# Patient Record
Sex: Male | Born: 1985 | Race: White | Hispanic: No | Marital: Single | Smoking: Never smoker
Health system: Southern US, Community
[De-identification: ages and names within clinical notes are randomized; demographics above are authoritative.]

## PROBLEM LIST (undated history)

## (undated) DIAGNOSIS — J45990 Exercise induced bronchospasm: Secondary | ICD-10-CM

## (undated) DIAGNOSIS — I429 Cardiomyopathy, unspecified: Secondary | ICD-10-CM

## (undated) DIAGNOSIS — F419 Anxiety disorder, unspecified: Secondary | ICD-10-CM

## (undated) DIAGNOSIS — K859 Acute pancreatitis without necrosis or infection, unspecified: Secondary | ICD-10-CM

## (undated) DIAGNOSIS — M1990 Unspecified osteoarthritis, unspecified site: Secondary | ICD-10-CM

## (undated) DIAGNOSIS — K219 Gastro-esophageal reflux disease without esophagitis: Secondary | ICD-10-CM

## (undated) HISTORY — PX: PILONIDAL CYST EXCISION: SHX744

---
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
## 2005-05-17 ENCOUNTER — Emergency Department: Payer: Self-pay | Admitting: Unknown Physician Specialty

## 2005-06-27 ENCOUNTER — Ambulatory Visit: Payer: Self-pay | Admitting: Gastroenterology

## 2006-09-04 HISTORY — PX: CHOLECYSTECTOMY: SHX55

## 2007-03-10 ENCOUNTER — Emergency Department: Payer: Self-pay | Admitting: Emergency Medicine

## 2009-06-02 ENCOUNTER — Ambulatory Visit: Payer: Self-pay | Admitting: Rheumatology

## 2009-09-02 ENCOUNTER — Ambulatory Visit: Payer: Self-pay | Admitting: Gastroenterology

## 2009-11-13 ENCOUNTER — Observation Stay: Payer: Self-pay | Admitting: Internal Medicine

## 2009-11-17 ENCOUNTER — Emergency Department: Payer: Self-pay | Admitting: Emergency Medicine

## 2009-11-19 ENCOUNTER — Ambulatory Visit: Payer: Self-pay | Admitting: Gastroenterology

## 2009-12-06 ENCOUNTER — Ambulatory Visit: Payer: Self-pay | Admitting: Gastroenterology

## 2009-12-11 ENCOUNTER — Emergency Department: Payer: Self-pay | Admitting: Unknown Physician Specialty

## 2010-08-20 IMAGING — CT CT ABD-PELV W/ CM
1 of 2 series · 15 of 32 positions shown, 19 images · non-contrast
Comparison: none

REASON FOR EXAM: GENERALIZED ABD PAIN NAUSEA VOMITING EVAL CBD ESPECIALLY
QUESTION DILAT.Alibert...
COMMENTS:

PROCEDURE:     CT  - CT ABDOMEN / PELVIS  W  - November 19, 2009  [DATE]
RESULT:
HISTORY: Nausea vomiting.

[Series 2: soft tissue · axial · 0.70mm/px · z∈[-361,+79]mm · 15 of 96 slices shown, 19 images]
[im 4/96  soft-tissue]
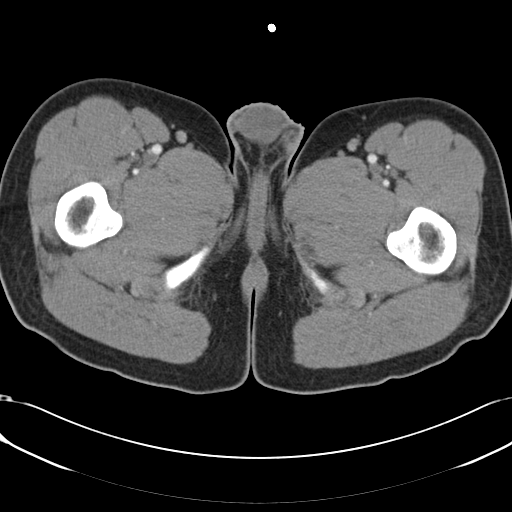
[im 4/96  bone]
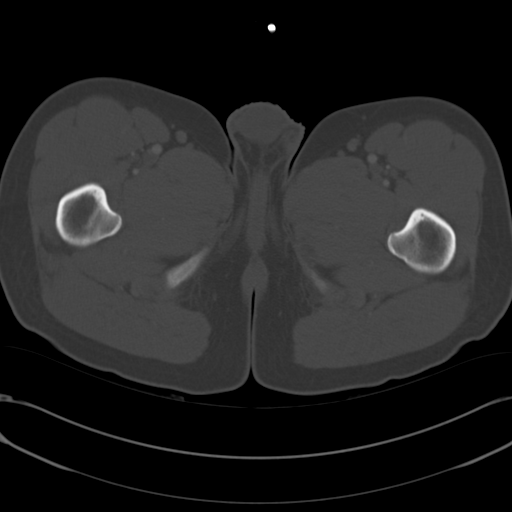
[im 12/96  soft-tissue]
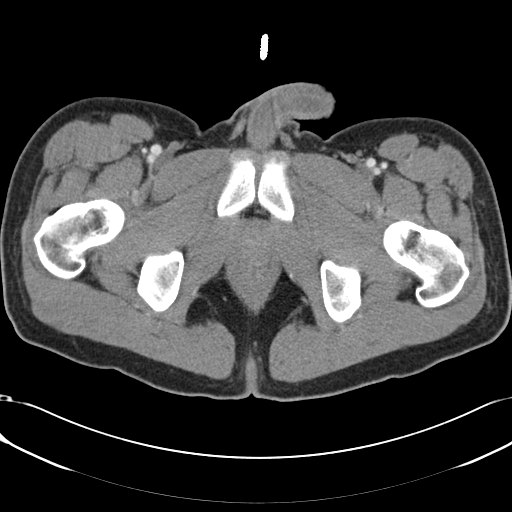
[im 20/96  soft-tissue]
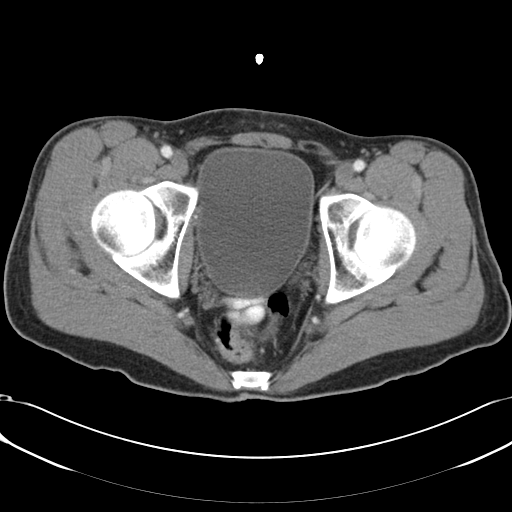
[im 28/96  soft-tissue]
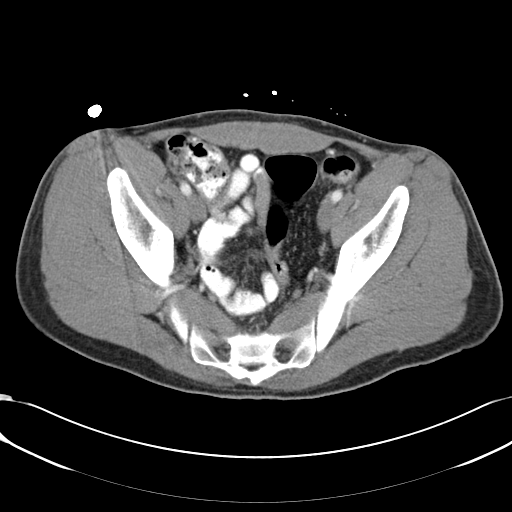
[im 32/96  soft-tissue]
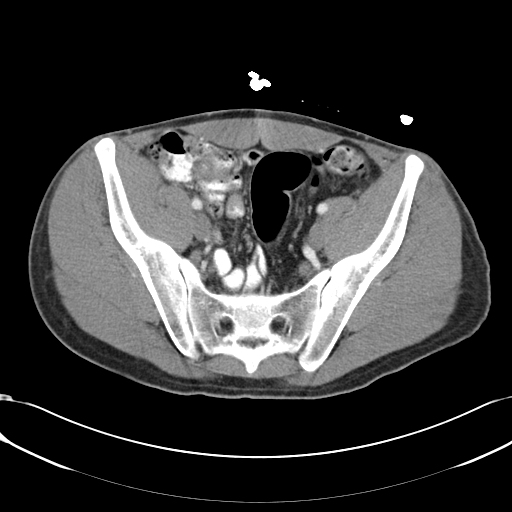
[im 40/96  soft-tissue]
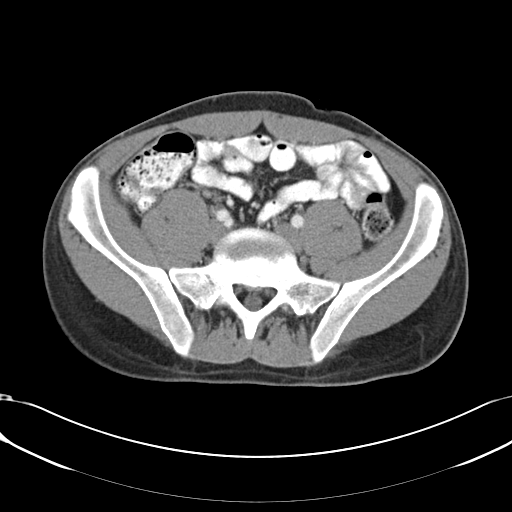
[im 48/96  soft-tissue]
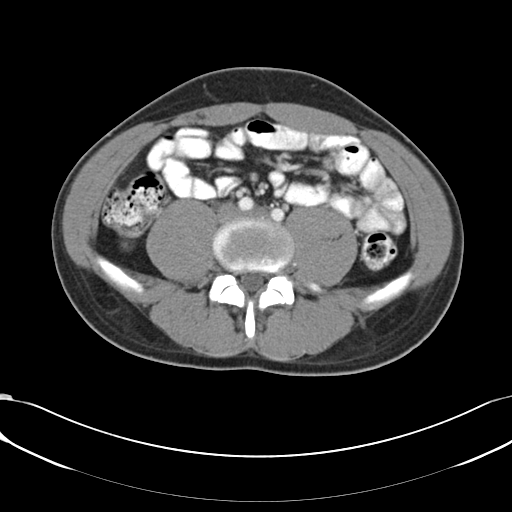
[im 56/96  soft-tissue]
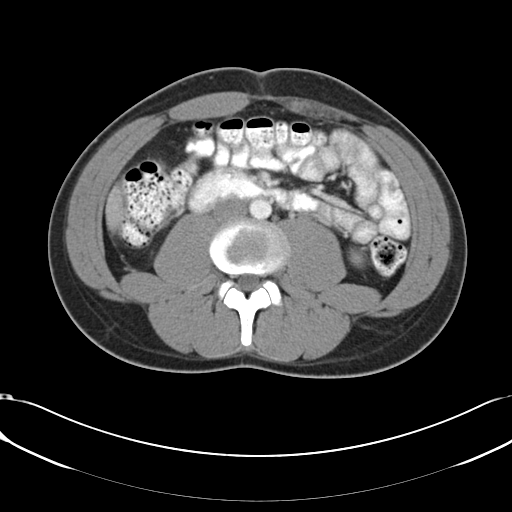
[im 64/96  soft-tissue]
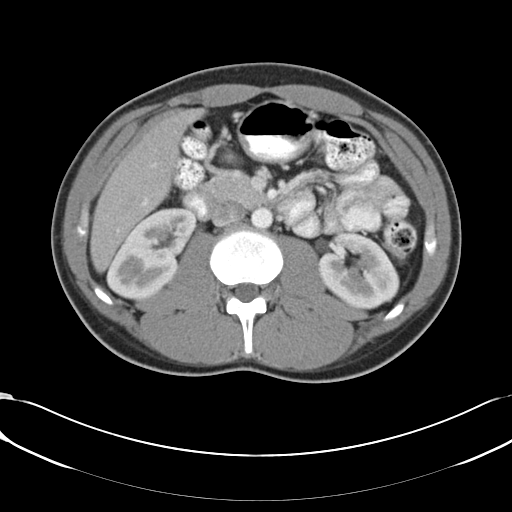
[im 64/96  bone]
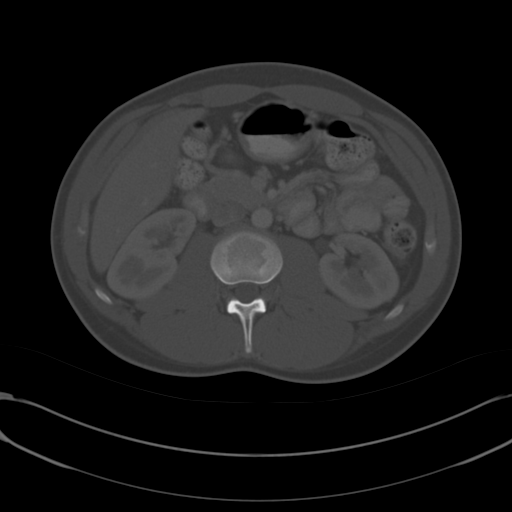
[im 68/96  soft-tissue]
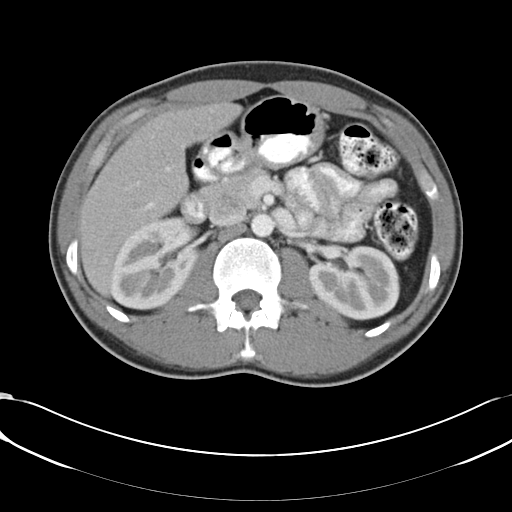
[im 76/96  soft-tissue]
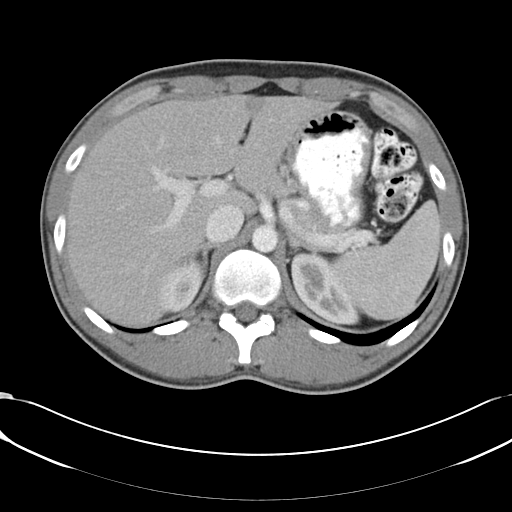
[im 80/96  lung]
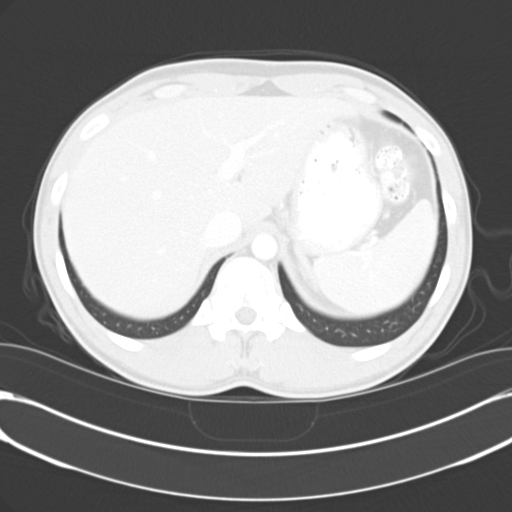
[im 84/96  soft-tissue]
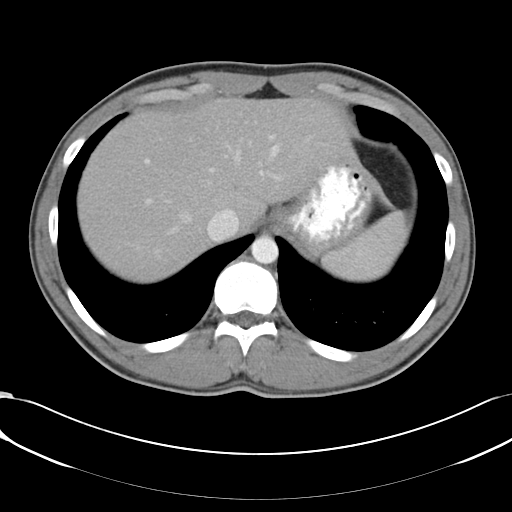
[im 84/96  lung]
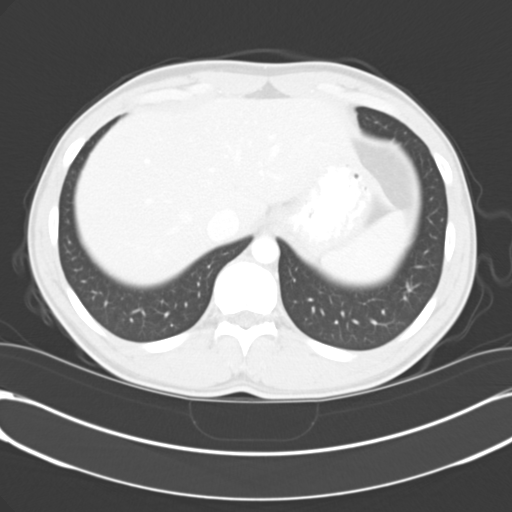
[im 88/96  lung]
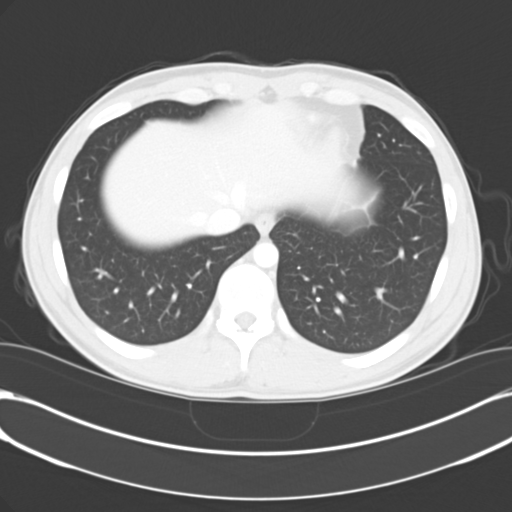
[im 92/96  soft-tissue]
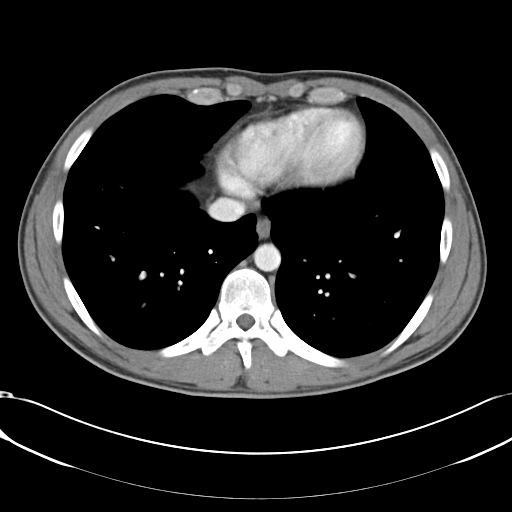
[im 92/96  lung]
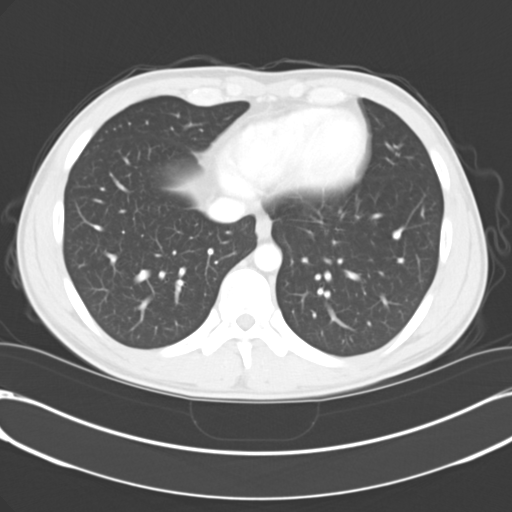

[15 of 32 positions shown; findings below may reference images not displayed]

PROCEDURE AND FINDINGS:   Following administration of 90 mL [id number], CT
was obtained. Evaluation on Syngo via was performed. The liver and spleen
are normal. Surgical clips are noted in the gallbladder fossa. The pancreas
is unremarkable. Adrenals are unremarkable. No kidney abnormalities are
identified. There is no hydronephrosis. There is no bowel distention. Right
lower quadrant is unremarkable. The appendix is unremarkable. No pathologic
pelvic fluid collection is identified. There is no adenopathy. The aorta is
unremarkable. Evaluation on separate workstation using Syngo via reveals no
focal abnormality.
IMPRESSION: 1.     No acute abnormality identified.
2.     No evidence of bowel distention or free air.

## 2020-09-13 ENCOUNTER — Ambulatory Visit
Admission: EM | Admit: 2020-09-13 | Discharge: 2020-09-13 | Disposition: A | Discharge status: Home / Self Care | Payer: BC Managed Care – PPO | Attending: Family Medicine | Admitting: Family Medicine

## 2020-09-13 ENCOUNTER — Encounter: Payer: Self-pay | Admitting: Family Medicine

## 2020-09-13 DIAGNOSIS — J029 Acute pharyngitis, unspecified: Secondary | ICD-10-CM

## 2020-09-13 LAB — POCT RAPID STREP A (OFFICE): Rapid Strep A Screen: NEGATIVE

## 2020-09-13 MED ORDER — DEXAMETHASONE SODIUM PHOSPHATE 10 MG/ML IJ SOLN
10.0000 mg | Freq: Once | INTRAMUSCULAR | Status: AC
  Administered 2020-09-13: 10 mg via INTRAMUSCULAR

## 2020-09-13 NOTE — Discharge Instructions (Signed)
Your strep test was negative here today.  We are sending for culture.  I am giving a steroid injection for your tonsillar swelling and inflammation.  He can continue over-the-counter medicines as needed.  Warm salt water gargles to the throat Covid test pending.  Follow up as needed for continued or worsening symptoms

## 2020-09-13 NOTE — ED Provider Notes (Signed)
Renaldo Fiddler    CSN: [id number] Arrival date & time: 09/13/20  0865      History   Chief Complaint Chief Complaint  Patient presents with  . Sore Throat    HPI Ryan Potts is a 35 y.o. male.   Patient is a 34 year old male who presents today with sore throat, productive cough with yellow sputum, subjective fever, body aches onset Thursday.  Tonsillar swelling with erythema.  Taking DayQuil.  Denies any shortness of breath, nausea, vomiting, diarrhea or ear pain.     History reviewed. No pertinent past medical history.  There are no problems to display for this patient.   History reviewed. No pertinent surgical history.     Home Medications    Prior to Admission medications   Medication Sig Start Date End Date Taking? Authorizing Provider  cetirizine (ZYRTEC) 10 MG tablet Take by mouth.    [provider]    Family History History reviewed. No pertinent family history.  Social History Social History   Tobacco Use  . Smoking status: Never Smoker  Substance Use Topics  . Alcohol use: Not Currently  . Drug use: Not Currently     Allergies   Patient has no known allergies.   Review of Systems Review of Systems   Physical Exam Triage Vital Signs ED Triage Vitals  Enc Vitals Group     BP 09/13/20 0943 120/79     Pulse Rate 09/13/20 0943 82     Resp 09/13/20 0943 17     Temp 09/13/20 0943 99.1 F (37.3 C)     Temp Source 09/13/20 0943 Oral     SpO2 09/13/20 0943 98 %     Weight 09/13/20 0952 190 lb (86.2 kg)     Height 09/13/20 0952 6' (1.829 m)     Head Circumference --      Peak Flow --      Pain Score 09/13/20 0952 4     Pain Loc --      Pain Edu? --      Excl. in GC? --    No data found.  Updated Vital Signs BP 120/79 (BP Location: Left Arm)   Pulse 82   Temp 99.1 F (37.3 C) (Oral)   Resp 17   Ht 6' (1.829 m)   Wt 190 lb (86.2 kg)   SpO2 98%   BMI 25.77 kg/m   Visual Acuity Right Eye Distance:    Left Eye Distance:   Bilateral Distance:    Right Eye Near:   Left Eye Near:    Bilateral Near:     Physical Exam Vitals and nursing note reviewed.  Constitutional:      General: He is not in acute distress.    Appearance: Normal appearance. He is not ill-appearing, toxic-appearing or diaphoretic.  HENT:     Head: Normocephalic and atraumatic.     Right Ear: Tympanic membrane and ear canal normal.     Left Ear: Tympanic membrane and ear canal normal.     Nose: Nose normal.     Mouth/Throat:     Pharynx: Oropharynx is clear. Posterior oropharyngeal erythema present.     Tonsils: 1+ on the right. 1+ on the left.  Eyes:     Conjunctiva/sclera: Conjunctivae normal.  Cardiovascular:     Rate and Rhythm: Normal rate and regular rhythm.  Pulmonary:     Effort: Pulmonary effort is normal.     Breath sounds: Normal breath sounds.  Musculoskeletal:  General: Normal range of motion.     Cervical back: Normal range of motion.  Skin:    General: Skin is warm and dry.  Neurological:     Mental Status: He is alert.  Psychiatric:        Mood and Affect: Mood normal.      UC Treatments / Results  Labs (all labs ordered are listed, but only abnormal results are displayed) Labs Reviewed  CULTURE, GROUP A STREP ([facility])  NOVEL CORONAVIRUS, NAA  POCT RAPID STREP A (OFFICE)    EKG   Radiology No results found.  Procedures Procedures (including critical care time)  Medications Ordered in UC Medications  dexamethasone (DECADRON) injection 10 mg (10 mg Intramuscular Given 09/13/20 1007)    Initial Impression / Assessment and Plan / UC Course  I have reviewed the triage vital signs and the nursing notes.  Pertinent labs & imaging results that were available during my care of the patient were reviewed by me and considered in my medical decision making (see chart for details).     Sore throat Rapid strep test negative.  Culture pending. Steroid injection given here  for tonsillar swelling, inflammation.  Over-the-counter medicines as needed. Covid test pending Follow up as needed for continued or worsening symptoms  Final Clinical Impressions(s) / UC Diagnoses   Final diagnoses:  Sore throat     Discharge Instructions     Your strep test was negative here today.  We are sending for culture.  I am giving a steroid injection for your tonsillar swelling and inflammation.  He can continue over-the-counter medicines as needed.  Warm salt water gargles to the throat Covid test pending.  Follow up as needed for continued or worsening symptoms     ED Prescriptions    None     PDMP not reviewed this encounter.   Janace Aris, NP 09/13/20 1040

## 2020-09-13 NOTE — ED Triage Notes (Signed)
Pt c/o sore throat, productive cough with yellow sputum, subjective fever, body aches onset Thursday.  Denies SOB, n/v/d, ear pain.  Taking dayquil-last dose this morning at 0700 Tonsils with erythema and exudate.

## 2020-09-14 LAB — NOVEL CORONAVIRUS, NAA: SARS-CoV-2, NAA: NOT DETECTED

## 2020-09-14 LAB — SARS-COV-2, NAA 2 DAY TAT

## 2020-09-15 ENCOUNTER — Telehealth: Payer: Self-pay | Admitting: Family

## 2020-09-15 DIAGNOSIS — J029 Acute pharyngitis, unspecified: Secondary | ICD-10-CM

## 2020-09-15 LAB — CULTURE, GROUP A STREP (THRC)

## 2020-09-15 MED ORDER — AMOXICILLIN 500 MG PO CAPS: 500.0000 mg | ORAL_CAPSULE | Freq: Two times a day (BID) | ORAL | 0 refills | Status: DC

## 2020-09-15 NOTE — Progress Notes (Signed)
We are sorry that you are not feeling well.  Here is how we plan to help!  I have reviewed your culture. It looks like it was positive for strep B, not strep A. Which is not as common. Will go ahead and treat with antibiotics given your symptoms.   Based on what you have shared with me it is likely that you have strep pharyngitis.  Strep pharyngitis is inflammation and infection in the back of the throat.  This is an infection cause by bacteria and is treated with antibiotics.  I have prescribed Amoxicillin 500 mg twice a day for 10 days. For throat pain, we recommend over the counter oral pain relief medications such as acetaminophen or aspirin, or anti-inflammatory medications such as ibuprofen or naproxen sodium. Topical treatments such as oral throat lozenges or sprays may be used as needed. Strep infections are not as easily transmitted as other respiratory infections, however we still recommend that you avoid close contact with loved ones, especially the very young and elderly.  Remember to wash your hands thoroughly throughout the day as this is the number one way to prevent the spread of infection and wipe down door knobs and counters with disinfectant.   Home Care:  Only take medications as instructed by your medical team.  Complete the entire course of an antibiotic.  Do not take these medications with alcohol.  A steam or ultrasonic humidifier can help congestion.  You can place a towel over your head and breathe in the steam from hot water coming from a faucet.  Avoid close contacts especially the very young and the elderly.  Cover your mouth when you cough or sneeze.  Always remember to wash your hands.  Get Help Right Away If:  You develop worsening fever or sinus pain.  You develop a severe head ache or visual changes.  Your symptoms persist after you have completed your treatment plan.  Make sure you  Understand these instructions.  Will watch your  condition.  Will get help right away if you are not doing well or get worse.  Your e-visit answers were reviewed by a board certified advanced clinical practitioner to complete your personal care plan.  Depending on the condition, your plan could have included both over the counter or prescription medications.  If there is a problem please reply  once you have received a response from your provider.  Your safety is important to [place].  If you have drug allergies check your prescription carefully.    You can use MyChart to ask questions about today's visit, request a non-urgent call back, or ask for a work or school excuse for 24 hours related to this e-Visit. If it has been greater than 24 hours you will need to follow up with your provider, or enter a new e-Visit to address those concerns.  You will get an e-mail in the next two days asking about your experience.  I hope that your e-visit has been valuable and will speed your recovery. Thank you for using e-visits.  \Approximately 5 minutes was spent documenting and reviewing patient's chart.

## 2022-11-24 ENCOUNTER — Other Ambulatory Visit: Payer: Self-pay

## 2022-11-24 DIAGNOSIS — R430 Anosmia: Secondary | ICD-10-CM

## 2022-12-02 ENCOUNTER — Other Ambulatory Visit: Payer: Self-pay | Admitting: Unknown Physician Specialty

## 2022-12-02 DIAGNOSIS — R430 Anosmia: Secondary | ICD-10-CM

## 2022-12-02 DIAGNOSIS — Z77018 Contact with and (suspected) exposure to other hazardous metals: Secondary | ICD-10-CM

## 2022-12-26 ENCOUNTER — Other Ambulatory Visit: Payer: Self-pay

## 2023-01-01 ENCOUNTER — Other Ambulatory Visit: Payer: Self-pay

## 2023-01-04 ENCOUNTER — Other Ambulatory Visit: Payer: Self-pay

## 2023-01-04 ENCOUNTER — Inpatient Hospital Stay: Admission: RE | Admit: 2023-01-04 | Payer: Self-pay | Source: Ambulatory Visit

## 2023-11-03 DIAGNOSIS — L0591 Pilonidal cyst without abscess: Secondary | ICD-10-CM

## 2023-11-03 HISTORY — DX: Pilonidal cyst without abscess: L05.91

## 2023-11-09 ENCOUNTER — Ambulatory Visit: Payer: Self-pay | Admitting: General Surgery

## 2023-11-09 NOTE — H&P (Signed)
 History of Present Illness The patient is a 38 year old who presents with a recurrent pilonidal cyst.   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 The cyst festers and then drains spontaneously, resolving temporarily before recurring. This cycle occurs approximately every week, more frequently than before when it took one to two weeks to become irritated. He does not take antibiotics during flare-ups as the cyst typically drains on its own, often while he is in the shower.  This is causing pain in the tailbone area.  Pain aggravated by pressure.  No alleviating factors.  He has a history of a previous pilonidal cyst that required incision and drainage intervention in an office setting. The current cyst is less severe than the previous one, which was larger and required packing after drainage.  The recurrent cyst causes discomfort, especially when sitting. He travels frequently for work, involving prolonged periods of sitting, which exacerbates the discomfort.      PAST MEDICAL HISTORY:  Past Medical History:  Diagnosis Date   Anxiety    Arthritis    Upper and lower back pain; works with chiropractor for this   GERD (gastroesophageal reflux disease)    Migraine    Pancreatitis ([facility])    Occurred after gallbladder surgery.        PAST SURGICAL HISTORY:   Past Surgical History:  Procedure Laterality Date   CHOLECYSTECTOMY     REDUCTION TESTICULAR TORSION           MEDICATIONS:  Outpatient Encounter Medications as of 11/09/2023  Medication Sig Dispense Refill   celecoxib (CELEBREX) 200 MG capsule Take 1 capsule (200 mg total) by mouth once daily as needed for Pain 30 capsule 11   cetirizine (ZYRTEC) 10 MG tablet Take 1 tablet by mouth once daily 90 tablet 1   docosahexaenoic acid/epa (FISH OIL ORAL) Take by mouth     pantoprazole (PROTONIX) 40 MG DR tablet Take 40 mg by mouth once daily     No facility-administered encounter medications  on file as of 11/09/2023.     ALLERGIES:   Patient has no known allergies.   SOCIAL HISTORY:  Social History   Socioeconomic History   Marital status: Married  Tobacco Use   Smoking status: Former    Current packs/day: 0.50    Types: Cigarettes   Smokeless tobacco: Never  Vaping Use   Vaping status: Every Day  Substance and Sexual Activity   Alcohol use: Yes   Drug use: Yes    Comment: marijuana   Sexual activity: Yes    Partners: Female   Social Drivers of Corporate investment banker Strain: Low Risk  (11/09/2023)   Overall Financial Resource Strain (CARDIA)    Difficulty of Paying Living Expenses: Not very hard  Food Insecurity: No Food Insecurity (11/09/2023)   Hunger Vital Sign    Worried About Running Out of Food in the Last Year: Never true    Ran Out of Food in the Last Year: Never true  Transportation Needs: No Transportation Needs (11/09/2023)   PRAPARE - Administrator, Civil Service (Medical): No    Lack of Transportation (Non-Medical): No    FAMILY HISTORY:  Family History  Problem Relation Name Age of Onset   Breast cancer Mother     Hepatitis C Father       GENERAL REVIEW OF SYSTEMS:   General ROS: negative for - chills, fatigue, fever, weight gain or  weight loss Allergy and Immunology ROS: negative for - hives  Hematological and Lymphatic ROS: negative for - bleeding problems or bruising, negative for palpable nodes Endocrine ROS: negative for - heat or cold intolerance, hair changes Respiratory ROS: negative for - cough, shortness of breath or wheezing Cardiovascular ROS: no chest pain or palpitations GI ROS: negative for nausea, vomiting, abdominal pain, diarrhea, constipation Musculoskeletal ROS: negative for - joint swelling or muscle pain Neurological ROS: negative for - confusion, syncope Dermatological ROS: negative for pruritus and rash  PHYSICAL EXAM:  Vitals:   11/09/23 0922  BP: 126/77  Pulse: 86  .  Ht:184 cm (6' 0.44")  Wt:90.3 kg (199 lb) OZD:GUYQ surface area is 2.15 meters squared. Body mass index is 26.66 kg/m.Marland Kitchen   GENERAL: Alert, active, oriented x3  HEENT: Pupils equal reactive to light. Extraocular movements are intact. Sclera clear. Palpebral conjunctiva normal red color.Pharynx clear.  NECK: Supple with no palpable mass and no adenopathy.  LUNGS: Sound clear with no rales rhonchi or wheezes.  HEART: Regular rhythm S1 and S2 without murmur.  BACK: There is a cavity to the left upper portion of the gluteal cleft.  There is multiple pits in the midline.  No sign of active abscess.  EXTREMITIES: Well-developed well-nourished symmetrical with no dependent edema.  NEUROLOGICAL: Awake alert oriented, facial expression symmetrical, moving all extremities.   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 Previous surgical intervention is noted.  Due to recurrence I offered the patient a cleft lift procedure.  He prefers less invasive treatment due to work commitments and recent parenthood.  Extensive excision of the left upper chronic cavity and communication with the pits will be performed, acknowledging the risk of recurrence and the potential need for more extensive surgery if symptoms worsen. Formal surgery carries a 30-40% risk of wound dehiscence, which could extend healing to several months. Schedule excision and drainage of the pilonidal cyst and associated pits after December 10, 2023. Ensure postoperative wound care and packing assistance. Monitor for recurrence and consider more extensive surgery if symptoms worsen.   Pilonidal cyst without abscess [L05.91]          Patient verbalized understanding, all questions were answered, and were agreeable with the plan outlined above.   Carolan Shiver, MD  Electronically signed by Carolan Shiver, MD

## 2023-11-09 NOTE — H&P (View-Only) (Signed)
 History of Present Illness The patient is a 38 year old who presents with a recurrent pilonidal cyst.   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 The cyst festers and then drains spontaneously, resolving temporarily before recurring. This cycle occurs approximately every week, more frequently than before when it took one to two weeks to become irritated. He does not take antibiotics during flare-ups as the cyst typically drains on its own, often while he is in the shower.  This is causing pain in the tailbone area.  Pain aggravated by pressure.  No alleviating factors.  He has a history of a previous pilonidal cyst that required incision and drainage intervention in an office setting. The current cyst is less severe than the previous one, which was larger and required packing after drainage.  The recurrent cyst causes discomfort, especially when sitting. He travels frequently for work, involving prolonged periods of sitting, which exacerbates the discomfort.      PAST MEDICAL HISTORY:  Past Medical History:  Diagnosis Date   Anxiety    Arthritis    Upper and lower back pain; works with chiropractor for this   GERD (gastroesophageal reflux disease)    Migraine    Pancreatitis ([facility])    Occurred after gallbladder surgery.        PAST SURGICAL HISTORY:   Past Surgical History:  Procedure Laterality Date   CHOLECYSTECTOMY     REDUCTION TESTICULAR TORSION           MEDICATIONS:  Outpatient Encounter Medications as of 11/09/2023  Medication Sig Dispense Refill   celecoxib (CELEBREX) 200 MG capsule Take 1 capsule (200 mg total) by mouth once daily as needed for Pain 30 capsule 11   cetirizine (ZYRTEC) 10 MG tablet Take 1 tablet by mouth once daily 90 tablet 1   docosahexaenoic acid/epa (FISH OIL ORAL) Take by mouth     pantoprazole (PROTONIX) 40 MG DR tablet Take 40 mg by mouth once daily     No facility-administered encounter medications  on file as of 11/09/2023.     ALLERGIES:   Patient has no known allergies.   SOCIAL HISTORY:  Social History   Socioeconomic History   Marital status: Married  Tobacco Use   Smoking status: Former    Current packs/day: 0.50    Types: Cigarettes   Smokeless tobacco: Never  Vaping Use   Vaping status: Every Day  Substance and Sexual Activity   Alcohol use: Yes   Drug use: Yes    Comment: marijuana   Sexual activity: Yes    Partners: Female   Social Drivers of Corporate investment banker Strain: Low Risk  (11/09/2023)   Overall Financial Resource Strain (CARDIA)    Difficulty of Paying Living Expenses: Not very hard  Food Insecurity: No Food Insecurity (11/09/2023)   Hunger Vital Sign    Worried About Running Out of Food in the Last Year: Never true    Ran Out of Food in the Last Year: Never true  Transportation Needs: No Transportation Needs (11/09/2023)   PRAPARE - Administrator, Civil Service (Medical): No    Lack of Transportation (Non-Medical): No    FAMILY HISTORY:  Family History  Problem Relation Name Age of Onset   Breast cancer Mother     Hepatitis C Father       GENERAL REVIEW OF SYSTEMS:   General ROS: negative for - chills, fatigue, fever, weight gain or  weight loss Allergy and Immunology ROS: negative for - hives  Hematological and Lymphatic ROS: negative for - bleeding problems or bruising, negative for palpable nodes Endocrine ROS: negative for - heat or cold intolerance, hair changes Respiratory ROS: negative for - cough, shortness of breath or wheezing Cardiovascular ROS: no chest pain or palpitations GI ROS: negative for nausea, vomiting, abdominal pain, diarrhea, constipation Musculoskeletal ROS: negative for - joint swelling or muscle pain Neurological ROS: negative for - confusion, syncope Dermatological ROS: negative for pruritus and rash  PHYSICAL EXAM:  Vitals:   11/09/23 0922  BP: 126/77  Pulse: 86  .  Ht:184 cm (6' 0.44")  Wt:90.3 kg (199 lb) OZD:GUYQ surface area is 2.15 meters squared. Body mass index is 26.66 kg/m.Marland Kitchen   GENERAL: Alert, active, oriented x3  HEENT: Pupils equal reactive to light. Extraocular movements are intact. Sclera clear. Palpebral conjunctiva normal red color.Pharynx clear.  NECK: Supple with no palpable mass and no adenopathy.  LUNGS: Sound clear with no rales rhonchi or wheezes.  HEART: Regular rhythm S1 and S2 without murmur.  BACK: There is a cavity to the left upper portion of the gluteal cleft.  There is multiple pits in the midline.  No sign of active abscess.  EXTREMITIES: Well-developed well-nourished symmetrical with no dependent edema.  NEUROLOGICAL: Awake alert oriented, facial expression symmetrical, moving all extremities.   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 Previous surgical intervention is noted.  Due to recurrence I offered the patient a cleft lift procedure.  He prefers less invasive treatment due to work commitments and recent parenthood.  Extensive excision of the left upper chronic cavity and communication with the pits will be performed, acknowledging the risk of recurrence and the potential need for more extensive surgery if symptoms worsen. Formal surgery carries a 30-40% risk of wound dehiscence, which could extend healing to several months. Schedule excision and drainage of the pilonidal cyst and associated pits after December 10, 2023. Ensure postoperative wound care and packing assistance. Monitor for recurrence and consider more extensive surgery if symptoms worsen.   Pilonidal cyst without abscess [L05.91]          Patient verbalized understanding, all questions were answered, and were agreeable with the plan outlined above.   Carolan Shiver, MD  Electronically signed by Carolan Shiver, MD

## 2023-11-16 ENCOUNTER — Encounter
Admission: RE | Admit: 2023-11-16 | Discharge: 2023-11-16 | Disposition: A | Discharge status: Home / Self Care | Source: Ambulatory Visit | Attending: General Surgery | Admitting: General Surgery

## 2023-11-16 ENCOUNTER — Other Ambulatory Visit: Payer: Self-pay

## 2023-11-16 HISTORY — DX: Gastro-esophageal reflux disease without esophagitis: K21.9

## 2023-11-16 HISTORY — DX: Exercise induced bronchospasm: J45.990

## 2023-11-16 HISTORY — DX: Acute pancreatitis without necrosis or infection, unspecified: K85.90

## 2023-11-16 HISTORY — DX: Anxiety disorder, unspecified: F41.9

## 2023-11-16 HISTORY — DX: Cardiomyopathy, unspecified: I42.9

## 2023-11-16 HISTORY — DX: Unspecified osteoarthritis, unspecified site: M19.90

## 2023-11-16 NOTE — Patient Instructions (Addendum)
 Your procedure is scheduled on: Monday, March 17 Report to the Registration Desk on the 1st floor of the [facility] To find out your arrival time, please call [phone] between 1PM - 3PM on: Friday, March 14 If your arrival time is 6:00 am, do not arrive before that time as the [facility] entrance doors do not open until 6:00 am.  REMEMBER: Instructions that are not followed completely may result in serious medical risk, up to and including death; or upon the discretion of your surgeon and anesthesiologist your surgery may need to be rescheduled.  Do not eat or drink after midnight the night before surgery.  No gum chewing or hard candies.  One week prior to surgery: Stop Anti-inflammatories (NSAIDS) such as Advil, Aleve, Ibuprofen, Motrin, Naproxen, Naprosyn and Aspirin based products such as Excedrin, Goody's Powder, BC Powder. Stop ANY OVER THE COUNTER supplements until after surgery. Stop moringa and fish oil.  You may however, continue to take Tylenol if needed for pain up until the day of surgery.  Continue taking all of your other prescription medications up until the day of surgery.  ON THE DAY OF SURGERY ONLY TAKE THESE MEDICATIONS WITH SIPS OF WATER:  Pantoprazole (Protonix)  No Alcohol for 24 hours before or after surgery.  No Smoking including e-cigarettes for 24 hours before surgery.  No chewable tobacco products for at least 6 hours before surgery.  No nicotine patches on the day of surgery.  Do not use any "recreational" drugs for at least a week (preferably 2 weeks) before your surgery.  Please be advised that the combination of cocaine and anesthesia may have negative outcomes, up to and including death. If you test positive for cocaine, your surgery will be cancelled.  On the morning of surgery brush your teeth with toothpaste and water, you may rinse your mouth with mouthwash if you wish. Do not swallow any toothpaste or mouthwash.  Shower before  coming to the hospital and wear clean clothes.  Do not wear jewelry, make-up, hairpins, clips or nail polish.  For welded (permanent) jewelry: bracelets, anklets, waist bands, etc.  Please have this removed prior to surgery.  If it is not removed, there is a chance that hospital personnel will need to cut it off on the day of surgery.  Do not wear lotions, powders, or perfumes.   Do not shave body hair from the neck down 48 hours before surgery.  Contact lenses, hearing aids and dentures may not be worn into surgery.  Do not bring valuables to the hospital. [facility] is not responsible for any missing/lost belongings or valuables.   Notify your doctor if there is any change in your medical condition (cold, fever, infection).  Wear comfortable clothing (specific to your surgery type) to the hospital.  After surgery, you can help prevent lung complications by doing breathing exercises.  Take deep breaths and cough every 1-2 hours.   If you are being discharged the day of surgery, you will not be allowed to drive home. You will need a responsible individual to drive you home and stay with you for 24 hours after surgery.   If you are taking public transportation, you will need to have a responsible individual with you.  Please call the Pre-admissions Testing Dept. at [phone] if you have any questions about these instructions.  Surgery Visitation Policy:  Patients having surgery or a procedure may have two visitors.  Children under the age of 27 must have an  adult with them who is not the patient.  Temporary Visitor Restrictions Due to increasing cases of flu, RSV and COVID-19: Children ages 31 and under will not be able to visit patients in [facility] hospitals under most circumstances.

## 2023-11-18 MED ORDER — CEFAZOLIN SODIUM-DEXTROSE 2-4 GM/100ML-% IV SOLN
2.0000 g | INTRAVENOUS | Status: AC
  Administered 2023-11-19: 2 g via INTRAVENOUS

## 2023-11-18 MED ORDER — ORAL CARE MOUTH RINSE: 15.0000 mL | Freq: Once | OROMUCOSAL | Status: AC

## 2023-11-18 MED ORDER — CHLORHEXIDINE GLUCONATE 0.12 % MT SOLN
15.0000 mL | Freq: Once | OROMUCOSAL | Status: AC
  Administered 2023-11-19: 15 mL via OROMUCOSAL

## 2023-11-18 MED ORDER — LACTATED RINGERS IV SOLN: INTRAVENOUS | Status: DC

## 2023-11-19 ENCOUNTER — Encounter: Payer: Self-pay | Admitting: General Surgery

## 2023-11-19 ENCOUNTER — Other Ambulatory Visit: Payer: Self-pay

## 2023-11-19 ENCOUNTER — Encounter
Admission: RE | Disposition: A | Discharge status: Home / Self Care | Payer: Self-pay | Source: Ambulatory Visit | Attending: General Surgery

## 2023-11-19 ENCOUNTER — Ambulatory Visit
Admission: RE | Admit: 2023-11-19 | Discharge: 2023-11-19 | Disposition: A | Discharge status: Home / Self Care | Source: Ambulatory Visit | Attending: General Surgery | Admitting: General Surgery

## 2023-11-19 ENCOUNTER — Ambulatory Visit: Payer: Self-pay | Admitting: Anesthesiology

## 2023-11-19 DIAGNOSIS — K219 Gastro-esophageal reflux disease without esophagitis: Secondary | ICD-10-CM | POA: Diagnosis not present

## 2023-11-19 DIAGNOSIS — F17290 Nicotine dependence, other tobacco product, uncomplicated: Secondary | ICD-10-CM | POA: Insufficient documentation

## 2023-11-19 DIAGNOSIS — L0591 Pilonidal cyst without abscess: Secondary | ICD-10-CM | POA: Diagnosis present

## 2023-11-19 HISTORY — PX: PILONIDAL CYST EXCISION: SHX744

## 2023-11-19 SURGERY — EXCISION, SIMPLE PILONIDAL CYST
Anesthesia: General | Site: Buttocks

## 2023-11-19 MED ORDER — ONDANSETRON HCL 4 MG/2ML IJ SOLN
INTRAMUSCULAR | Status: DC | PRN
Start: 2023-11-19 — End: 2023-11-19
  Administered 2023-11-19: 4 mg via INTRAVENOUS

## 2023-11-19 MED ORDER — LIDOCAINE HCL (PF) 2 % IJ SOLN
INTRAMUSCULAR | Status: AC
  Filled 2023-11-19: qty 5

## 2023-11-19 MED ORDER — BUPIVACAINE-EPINEPHRINE (PF) 0.5% -1:200000 IJ SOLN
INTRAMUSCULAR | Status: AC
  Filled 2023-11-19: qty 30

## 2023-11-19 MED ORDER — ACETAMINOPHEN 10 MG/ML IV SOLN
INTRAVENOUS | Status: AC
  Filled 2023-11-19: qty 100

## 2023-11-19 MED ORDER — ROCURONIUM BROMIDE 100 MG/10ML IV SOLN
INTRAVENOUS | Status: DC | PRN
  Administered 2023-11-19: 40 mg via INTRAVENOUS

## 2023-11-19 MED ORDER — OXYCODONE HCL 5 MG PO TABS
ORAL_TABLET | ORAL | Status: AC
  Filled 2023-11-19: qty 1

## 2023-11-19 MED ORDER — CHLORHEXIDINE GLUCONATE 0.12 % MT SOLN
OROMUCOSAL | Status: AC
  Filled 2023-11-19: qty 15

## 2023-11-19 MED ORDER — OXYCODONE HCL 5 MG PO TABS
5.0000 mg | ORAL_TABLET | Freq: Once | ORAL | Status: AC | PRN
  Administered 2023-11-19: 5 mg via ORAL

## 2023-11-19 MED ORDER — OXYCODONE HCL 5 MG/5ML PO SOLN: 5.0000 mg | Freq: Once | ORAL | Status: AC | PRN

## 2023-11-19 MED ORDER — PROPOFOL 10 MG/ML IV BOLUS
INTRAVENOUS | Status: AC
  Filled 2023-11-19: qty 40

## 2023-11-19 MED ORDER — BUPIVACAINE-EPINEPHRINE 0.5% -1:200000 IJ SOLN
INTRAMUSCULAR | Status: DC | PRN
  Administered 2023-11-19: 30 mL

## 2023-11-19 MED ORDER — FENTANYL CITRATE (PF) 100 MCG/2ML IJ SOLN
INTRAMUSCULAR | Status: DC | PRN
  Administered 2023-11-19: 100 ug via INTRAVENOUS

## 2023-11-19 MED ORDER — MIDAZOLAM HCL 2 MG/2ML IJ SOLN
INTRAMUSCULAR | Status: DC | PRN
  Administered 2023-11-19: 2 mg via INTRAVENOUS

## 2023-11-19 MED ORDER — LIDOCAINE HCL (CARDIAC) PF 100 MG/5ML IV SOSY
PREFILLED_SYRINGE | INTRAVENOUS | Status: DC | PRN
  Administered 2023-11-19: 80 mg via INTRAVENOUS

## 2023-11-19 MED ORDER — FENTANYL CITRATE (PF) 100 MCG/2ML IJ SOLN
INTRAMUSCULAR | Status: AC
  Filled 2023-11-19: qty 2

## 2023-11-19 MED ORDER — DROPERIDOL 2.5 MG/ML IJ SOLN: 0.6250 mg | Freq: Once | INTRAMUSCULAR | Status: DC | PRN

## 2023-11-19 MED ORDER — FENTANYL CITRATE (PF) 100 MCG/2ML IJ SOLN
25.0000 ug | INTRAMUSCULAR | Status: DC | PRN
  Administered 2023-11-19 (×4): 25 ug via INTRAVENOUS

## 2023-11-19 MED ORDER — DEXAMETHASONE SODIUM PHOSPHATE 10 MG/ML IJ SOLN
INTRAMUSCULAR | Status: DC | PRN
  Administered 2023-11-19: 4 mg via INTRAVENOUS

## 2023-11-19 MED ORDER — SUGAMMADEX SODIUM 200 MG/2ML IV SOLN
INTRAVENOUS | Status: DC | PRN
  Administered 2023-11-19: 200 mg via INTRAVENOUS

## 2023-11-19 MED ORDER — PROPOFOL 10 MG/ML IV BOLUS
INTRAVENOUS | Status: DC | PRN
  Administered 2023-11-19: 200 mg via INTRAVENOUS
  Administered 2023-11-19: 20 mg via INTRAVENOUS

## 2023-11-19 MED ORDER — HYDROCODONE-ACETAMINOPHEN 5-325 MG PO TABS: 1.0000 | ORAL_TABLET | Freq: Four times a day (QID) | ORAL | 0 refills | Status: AC | PRN

## 2023-11-19 MED ORDER — CEFAZOLIN SODIUM-DEXTROSE 2-4 GM/100ML-% IV SOLN
INTRAVENOUS | Status: AC
  Filled 2023-11-19: qty 100

## 2023-11-19 MED ORDER — ACETAMINOPHEN 10 MG/ML IV SOLN: 1000.0000 mg | Freq: Once | INTRAVENOUS | Status: DC | PRN

## 2023-11-19 MED ORDER — MIDAZOLAM HCL 2 MG/2ML IJ SOLN
INTRAMUSCULAR | Status: AC
  Filled 2023-11-19: qty 2

## 2023-11-19 MED ORDER — ACETAMINOPHEN 10 MG/ML IV SOLN
INTRAVENOUS | Status: DC | PRN
Start: 2023-11-19 — End: 2023-11-19
  Administered 2023-11-19: 1000 mg via INTRAVENOUS

## 2023-11-19 SURGICAL SUPPLY — 23 items
BENZOIN TINCTURE PRP APPL 2/3 (GAUZE/BANDAGES/DRESSINGS) ×1 IMPLANT
BLADE CLIPPER SURG (BLADE) IMPLANT
DRAPE LAPAROTOMY 100X77 ABD (DRAPES) ×1 IMPLANT
ELECT REM PT RETURN 9FT ADLT (ELECTROSURGICAL) ×1 IMPLANT
ELECTRODE REM PT RTRN 9FT ADLT (ELECTROSURGICAL) ×1 IMPLANT
GAUZE 4X4 16PLY ~~LOC~~+RFID DBL (SPONGE) IMPLANT
GAUZE SPONGE 4X4 12PLY STRL (GAUZE/BANDAGES/DRESSINGS) IMPLANT
GLOVE BIO SURGEON STRL SZ 6.5 (GLOVE) ×1 IMPLANT
GLOVE BIOGEL PI IND STRL 6.5 (GLOVE) ×1 IMPLANT
GOWN STRL REUS W/ TWL LRG LVL3 (GOWN DISPOSABLE) ×2 IMPLANT
MANIFOLD NEPTUNE II (INSTRUMENTS) ×1 IMPLANT
NS IRRIG 500ML POUR BTL (IV SOLUTION) ×1 IMPLANT
PACK BASIN MINOR ARMC (MISCELLANEOUS) ×1 IMPLANT
SOL PREP PVP 2OZ (MISCELLANEOUS) ×1 IMPLANT
SOLUTION PREP PVP 2OZ (MISCELLANEOUS) ×1 IMPLANT
SUT ETHILON 2 0 FS 18 (SUTURE) ×1 IMPLANT
SUT VIC AB 2-0 CT1 (SUTURE) ×1 IMPLANT
SUT VIC AB 2-0 CT1 TAPERPNT 27 (SUTURE) ×1 IMPLANT
SYR 10ML LL (SYRINGE) ×1 IMPLANT
SYR BULB IRRIG 60ML STRL (SYRINGE) ×1 IMPLANT
TAPE CLOTH 3X10 WHT NS LF (GAUZE/BANDAGES/DRESSINGS) ×1 IMPLANT
TRAP FLUID SMOKE EVACUATOR (MISCELLANEOUS) ×1 IMPLANT
WATER STERILE IRR 500ML POUR (IV SOLUTION) ×1 IMPLANT

## 2023-11-19 NOTE — Transfer of Care (Signed)
 Immediate Anesthesia Transfer of Care Note  Patient: Adolph Clutter  Procedure(s) Performed: EXCISION, SIMPLE PILONIDAL CYST (Buttocks)  Patient Location: PACU  Anesthesia Type:General  Level of Consciousness: awake, alert , and oriented  Airway & Oxygen Therapy: Patient Spontanous Breathing and Patient connected to face mask oxygen  Post-op Assessment: Report given to RN and Post -op Vital signs reviewed and stable  Post vital signs: stable  Last Vitals:  Vitals Value Taken Time  BP 108/54 11/19/23 1049  Temp    Pulse 71 11/19/23 1051  Resp 16 11/19/23 1051  SpO2 100 % 11/19/23 1051  Vitals shown include unfiled device data.  Last Pain:  Vitals:   11/19/23 0850  TempSrc: Temporal  PainSc: 5          Complications: No notable events documented.

## 2023-11-19 NOTE — Interval H&P Note (Signed)
 History and Physical Interval Note:  11/19/2023 9:30 AM  Ryan Potts  has presented today for surgery, with the diagnosis of L05.91 Pilonidal cyst w/o abscess.  The various methods of treatment have been discussed with the patient and family. After consideration of risks, benefits and other options for treatment, the patient has consented to  Procedure(s): EXCISION, SIMPLE PILONIDAL CYST (N/A) as a surgical intervention.  The patient's history has been reviewed, patient examined, no change in status, stable for surgery.  I have reviewed the patient's chart and labs.  Questions were answered to the patient's satisfaction.     Carolan Shiver

## 2023-11-19 NOTE — Op Note (Signed)
 Preoperative diagnosis: Chronic pilonidal cyst  Postoperative diagnosis: Chronic pilonidal cyst.  Procedure: Excision of chronic pilonidal disease.   Anesthesia: General  Surgeon: Dr. Jerene Dilling, MD  Indications: Patient is a 38 y.o. male who has chronic pilonidal cyst requiring incision and drainage limited to the midline and currently without evidence of infection. Excision was elected for management.  Findings:  1.  Chronic cavity in the left upper gluteal cleft with multiple midline pits 2.  No active infection identified  Description of procedure: The patient was brought to the operating room and underwent general anesthesia and endotracheal intubation. All appropriate monitoring devices were in place. The patient was then placed in the prone jackknife position, and the buttocks were gently spread with tape. All pressure points were padded, and then the presacral region was prepped and draped in the usual sterile fashion. A time-out was completed verifying correct patient, procedure, site, positioning, and implant(s) and/or special equipment prior to beginning this procedure. Skin marking the external disease margins and the "safe zone" of deep dissection on the gluteal cleft was done.  A probe was inserted and the midline tract readily identified. An elliptical incision was made around the openings and the entire tract. This was deepened through subcutaneous tissue using electrocautery. The incision was continued until normal tissue deep to the tract was encountered. The pilonidal sinus tract, granulation tissue and debri was thus excised cleanly in its entirety.  Hemostasis was achieved with electrocautery. After ensuring that there is no infection and that the wound was clean, the wounds were irrigated with saline.  A wet-to-dry packing was applied in each of the open wounds.  The wound was dressed.  The patient tolerated the procedure well, was extubated and reversed from general  anesthesia. Then, the patient was taken to the postanesthesia care unit in stable condition.   Specimen: Pilonidal cyst  Complications: None  EBL: 5 mL

## 2023-11-19 NOTE — Anesthesia Preprocedure Evaluation (Signed)
 Anesthesia Evaluation  Patient identified by MRN, date of birth, ID band Patient awake    Reviewed: Allergy & Precautions, H&P , NPO status , Patient's Chart, lab work & pertinent test results, reviewed documented beta blocker date and time   Airway Mallampati: II  TM Distance: >3 FB Neck ROM: full    Dental  (+) Teeth Intact   Pulmonary asthma , former smoker   Pulmonary exam normal        Cardiovascular negative cardio ROS Normal cardiovascular exam Rhythm:regular Rate:Normal     Neuro/Psych   Anxiety     negative neurological ROS  negative psych ROS   GI/Hepatic Neg liver ROS,GERD  Medicated,,  Endo/Other  negative endocrine ROS    Renal/GU negative Renal ROS  negative genitourinary   Musculoskeletal   Abdominal   Peds  Hematology negative hematology ROS (+)   Anesthesia Other Findings Past Medical History: No date: Anxiety No date: Arthritis No date: Cardiomyopathy [facility])     Comment:  in teenage years 11/2023: 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 No date: Exercise-induced asthma No date: GERD (gastroesophageal reflux disease) No date: Pancreatitis Past Surgical History: 2008: CHOLECYSTECTOMY No date: PILONIDAL CYST EXCISION 1989: TESTICLE TORSION REDUCTION BMI    Body Mass Index: 25.07 kg/m     Reproductive/Obstetrics negative OB ROS                             Anesthesia Physical Anesthesia Plan  ASA: 3  Anesthesia Plan: General ETT   Post-op Pain Management:    Induction:   PONV Risk Score and Plan: 3  Airway Management Planned:   Additional Equipment:   Intra-op Plan:   Post-operative Plan:   Informed Consent: I have reviewed the patients History and Physical, chart, labs and discussed the procedure including the risks, benefits and alternatives for the proposed anesthesia with the patient or authorized representative who has indicated his/her  understanding and acceptance.     Dental Advisory Given  Plan Discussed with: CRNA  Anesthesia Plan Comments:        Anesthesia Quick Evaluation

## 2023-11-19 NOTE — Anesthesia Procedure Notes (Signed)
 Procedure Name: Intubation Date/Time: 11/19/2023 10:06 AM  Performed by: Darrell Jewel I, CRNAPre-anesthesia Checklist: Patient identified, Patient being monitored, Timeout performed, Emergency Drugs available and Suction available Patient Re-evaluated:Patient Re-evaluated prior to induction Oxygen Delivery Method: Circle system utilized Preoxygenation: Pre-oxygenation with 100% oxygen Induction Type: IV induction Ventilation: Mask ventilation without difficulty Laryngoscope Size: McGrath and 4 Grade View: Grade I Tube type: Oral Tube size: 7.5 mm Number of attempts: 1 Airway Equipment and Method: Stylet Placement Confirmation: ETT inserted through vocal cords under direct vision, positive ETCO2 and breath sounds checked- equal and bilateral Secured at: 21 cm Tube secured with: Tape Dental Injury: Teeth and Oropharynx as per pre-operative assessment

## 2023-11-19 NOTE — Discharge Instructions (Addendum)
  Diet: Resume home heart healthy regular diet.   Activity: Increase activity as tolerated. Light activity and walking are encouraged. Do not drive or drink alcohol if taking narcotic pain medications.  Wound care: Remove dressing tomorrow. Once dressing removed, may shower with soapy water and pat dry (do not rub incisions), but no baths or submerging incision underwater until follow-up. (no swimming)   Continue wet to dry packing of wound daily.   Medications: Resume all home medications. For mild to moderate pain: acetaminophen (Tylenol) or ibuprofen (if no kidney disease). Combining Tylenol with alcohol can substantially increase your risk of causing liver disease. Narcotic pain medications, if prescribed, can be used for severe pain, though may cause nausea, constipation, and drowsiness. Do not combine Tylenol and Norco within a 6 hour period as Norco contains Tylenol. If you do not need the narcotic pain medication, you do not need to fill the prescription.  Call office [phone]) at any time if any questions, worsening pain, fevers/chills, bleeding, drainage from incision site, or other concerns.

## 2023-11-20 ENCOUNTER — Encounter: Payer: Self-pay | Admitting: General Surgery

## 2023-11-20 LAB — SURGICAL PATHOLOGY

## 2023-11-20 NOTE — Anesthesia Postprocedure Evaluation (Signed)
 Anesthesia Post Note  Patient: Ryan Potts  Procedure(s) Performed: EXCISION, SIMPLE PILONIDAL CYST (Buttocks)  Patient location during evaluation: PACU Anesthesia Type: General Level of consciousness: awake and alert Pain management: pain level controlled Vital Signs Assessment: post-procedure vital signs reviewed and stable Respiratory status: spontaneous breathing, nonlabored ventilation, respiratory function stable and patient connected to nasal cannula oxygen Cardiovascular status: blood pressure returned to baseline and stable Postop Assessment: no apparent nausea or vomiting Anesthetic complications: no   No notable events documented.   Last Vitals:  Vitals:   11/19/23 1145 11/19/23 1155  BP: 108/78 112/69  Pulse: (!) 59 61  Resp: 19 16  Temp:  36.5 C  SpO2: 99% 100%    Last Pain:  Vitals:   11/19/23 1155  TempSrc: Temporal  PainSc: 1                  Yevette Edwards

## 2023-11-30 ENCOUNTER — Other Ambulatory Visit

## 2023-12-10 ENCOUNTER — Ambulatory Visit: Admit: 2023-12-10 | Admitting: General Surgery

## 2023-12-10 SURGERY — EXCISION, SIMPLE PILONIDAL CYST
Anesthesia: General | Site: Buttocks

## 2024-08-01 ENCOUNTER — Emergency Department: Payer: Self-pay

## 2024-08-01 ENCOUNTER — Other Ambulatory Visit: Payer: Self-pay

## 2024-08-01 ENCOUNTER — Emergency Department
Admission: EM | Admit: 2024-08-01 | Discharge: 2024-08-01 | Disposition: A | Discharge status: Home / Self Care | Payer: Self-pay | Attending: Emergency Medicine | Admitting: Emergency Medicine

## 2024-08-01 DIAGNOSIS — K529 Noninfective gastroenteritis and colitis, unspecified: Secondary | ICD-10-CM | POA: Insufficient documentation

## 2024-08-01 DIAGNOSIS — I10 Essential (primary) hypertension: Secondary | ICD-10-CM | POA: Insufficient documentation

## 2024-08-01 DIAGNOSIS — R1013 Epigastric pain: Secondary | ICD-10-CM

## 2024-08-01 DIAGNOSIS — R112 Nausea with vomiting, unspecified: Secondary | ICD-10-CM

## 2024-08-01 LAB — TROPONIN T, HIGH SENSITIVITY
Troponin T High Sensitivity: <15 ng/L (ref 0–19)
Troponin T High Sensitivity: <15 ng/L (ref 0–19)

## 2024-08-01 LAB — COMPREHENSIVE METABOLIC PANEL WITH GFR
ALT: 16 U/L (ref 0–44)
AST: 22 U/L (ref 15–41)
Albumin: 4.4 g/dL (ref 3.5–5.0)
Alkaline Phosphatase: 64 U/L (ref 38–126)
Anion gap: 11 (ref 5–15)
BUN: 11 mg/dL (ref 6–20)
CO2: 23 mmol/L (ref 22–32)
Calcium: 9.4 mg/dL (ref 8.9–10.3)
Chloride: 103 mmol/L (ref 98–111)
Creatinine, Ser: 1.1 mg/dL (ref 0.61–1.24)
GFR, Estimated: >60 mL/min (ref >60)
Glucose, Bld: 97 mg/dL (ref 70–99)
Potassium: 3.5 mmol/L (ref 3.5–5.1)
Sodium: 138 mmol/L (ref 135–145)
Total Bilirubin: 0.5 mg/dL (ref 0.0–1.2)
Total Protein: 7 g/dL (ref 6.5–8.1)

## 2024-08-01 LAB — URINALYSIS, ROUTINE W REFLEX MICROSCOPIC
Bilirubin Urine: NEGATIVE
Glucose, UA: NEGATIVE mg/dL
Hgb urine dipstick: NEGATIVE
Ketones, ur: NEGATIVE mg/dL
Leukocytes,Ua: NEGATIVE
Nitrite: NEGATIVE
Protein, ur: NEGATIVE mg/dL
Specific Gravity, Urine: 1.005 (ref 1.005–1.030)
pH: 5 (ref 5.0–8.0)

## 2024-08-01 LAB — CBC
HCT: 40 % (ref 39.0–52.0)
Hemoglobin: 13.8 g/dL (ref 13.0–17.0)
MCH: 30.7 pg (ref 26.0–34.0)
MCHC: 34.5 g/dL (ref 30.0–36.0)
MCV: 89.1 fL (ref 80.0–100.0)
Platelets: 237 K/uL (ref 150–400)
RBC: 4.49 MIL/uL (ref 4.22–5.81)
RDW: 12.1 % (ref 11.5–15.5)
WBC: 13 K/uL — ABNORMAL HIGH (ref 4.0–10.5)
nRBC: 0 % (ref 0.0–0.2)

## 2024-08-01 LAB — LIPASE, BLOOD: Lipase: 43 U/L (ref 11–51)

## 2024-08-01 MED ORDER — ONDANSETRON 4 MG PO TBDP: 4.0000 mg | ORAL_TABLET | Freq: Three times a day (TID) | ORAL | 0 refills | Status: AC | PRN

## 2024-08-01 MED ORDER — ONDANSETRON HCL 4 MG/2ML IJ SOLN
4.0000 mg | Freq: Once | INTRAMUSCULAR | Status: AC
  Administered 2024-08-01: 4 mg via INTRAVENOUS
  Filled 2024-08-01: qty 2

## 2024-08-01 MED ORDER — KETOROLAC TROMETHAMINE 15 MG/ML IJ SOLN
15.0000 mg | Freq: Once | INTRAMUSCULAR | Status: AC
  Administered 2024-08-01: 15 mg via INTRAVENOUS
  Filled 2024-08-01: qty 1

## 2024-08-01 MED ORDER — IOHEXOL 300 MG/ML  SOLN
100.0000 mL | Freq: Once | INTRAMUSCULAR | Status: AC | PRN
  Administered 2024-08-01: 100 mL via INTRAVENOUS

## 2024-08-01 MED ORDER — SODIUM CHLORIDE 0.9 % IV BOLUS
1000.0000 mL | Freq: Once | INTRAVENOUS | Status: AC
Start: 2024-08-01 — End: 2024-08-01
  Administered 2024-08-01: 1000 mL via INTRAVENOUS

## 2024-08-01 MED ORDER — AMOXICILLIN-POT CLAVULANATE 875-125 MG PO TABS: 1.0000 | ORAL_TABLET | Freq: Two times a day (BID) | ORAL | 0 refills | Status: AC

## 2024-08-01 MED ORDER — PANTOPRAZOLE SODIUM 40 MG PO TBEC: 40.0000 mg | DELAYED_RELEASE_TABLET | Freq: Every day | ORAL | 2 refills | Status: AC

## 2024-08-01 MED ORDER — MORPHINE SULFATE (PF) 4 MG/ML IV SOLN
4.0000 mg | Freq: Once | INTRAVENOUS | Status: AC
  Administered 2024-08-01: 4 mg via INTRAVENOUS
  Filled 2024-08-01: qty 1

## 2024-08-01 MED ORDER — AMOXICILLIN-POT CLAVULANATE 875-125 MG PO TABS
1.0000 | ORAL_TABLET | Freq: Once | ORAL | Status: AC
  Administered 2024-08-01: 1 via ORAL
  Filled 2024-08-01: qty 1

## 2024-08-01 NOTE — ED Triage Notes (Signed)
 Pt reports upper and lower abdominal pain, radiates through his back. Vomiting. Symptoms for 5 days. Hx of pancreatitis. Last took tramadol for pain last night.

## 2024-08-01 NOTE — ED Provider Notes (Signed)
 [facility] Provider Note    Event Date/Time   First MD Initiated Contact with Patient 08/01/24 [phone]     (approximate)   History   Abdominal Pain   HPI  Ryan Potts is a 38 y.o. male   Past medical history of remote cholecystectomy, recurrent pancreatitis, minimal alcohol use, here with epigastric abdominal pain radiating to the mid center back associate with nausea and vomiting over the last 4 to 5 days.  Over the last past year has had intentional weight loss of approximately 30 pounds.  No new medications.  Stopped taking Protonix after his doctor discontinued it.  Bowel movements have been normal.  Vomiting but no GI bleeding.  No chest pain or shortness of breath.  Independent Historian contributed to assessment above: Father-in-law corroborates information past medical history as above  External Medical Documents Reviewed: Previous outpatient notes      Physical Exam   Triage Vital Signs: ED Triage Vitals  Encounter Vitals Group     BP 08/01/24 0531 (!) 141/83     Girls Systolic BP Percentile --      Girls Diastolic BP Percentile --      Boys Systolic BP Percentile --      Boys Diastolic BP Percentile --      Pulse Rate 08/01/24 0531 63     Resp 08/01/24 0531 16     Temp 08/01/24 0531 (!) 97.5 F (36.4 C)     Temp Source 08/01/24 0531 Oral     SpO2 08/01/24 0531 100 %     Weight --      Height --      Head Circumference --      Peak Flow --      Pain Score 08/01/24 0530 7     Pain Loc --      Pain Education --      Exclude from Growth Chart --     Most recent vital signs: Vitals:   08/01/24 0531  BP: (!) 141/83  Pulse: 63  Resp: 16  Temp: (!) 97.5 F (36.4 C)  SpO2: 100%    General: Awake, no distress.  CV:  Good peripheral perfusion.  Resp:  Normal effort.  Abd:  No distention.  Other:  Epigastric tenderness to palpation, mild lower quadrants tender but not as significant as epigastrium, no rigidity or  guarding.  Hemodynamics appropriate reassuring abdomen slightly hypertensive.  Breathing comfortably on room air.  Lungs clear to auscultation without focality or wheezing, heart sounds normal without murmur.   ED Results / Procedures / Treatments   Labs (all labs ordered are listed, but only abnormal results are displayed) Labs Reviewed  CBC - Abnormal; Notable for the following components:      Result Value   WBC 13.0 (*)    All other components within normal limits  LIPASE, BLOOD  COMPREHENSIVE METABOLIC PANEL WITH GFR  URINALYSIS, ROUTINE W REFLEX MICROSCOPIC  TROPONIN T, HIGH SENSITIVITY     I ordered and reviewed the above labs they are notable for leukocytosis 13  EKG  ED ECG REPORT I, Ginnie Shams, the attending physician, personally viewed and interpreted this ECG.   Date: 08/01/2024  EKG Time: 0623  Rate: 66  Rhythm: sinus  Axis: nl  Intervals:nl  ST&T Change: no stemi   PROCEDURES:  Critical Care performed: No  Procedures   MEDICATIONS ORDERED IN ED: Medications  sodium chloride 0.9 % bolus 1,000 mL (1,000 mLs Intravenous New Bag/Given  08/01/24 0557)  morphine (PF) 4 MG/ML injection 4 mg (4 mg Intravenous Given 08/01/24 0557)  ketorolac (TORADOL) 15 MG/ML injection 15 mg (15 mg Intravenous Given 08/01/24 0556)  ondansetron  (ZOFRAN ) injection 4 mg (4 mg Intravenous Given 08/01/24 0556)     IMPRESSION / MDM / ASSESSMENT AND PLAN / ED COURSE  I reviewed the triage vital signs and the nursing notes.                                Patient's presentation is most consistent with acute presentation with potential threat to life or bodily function.  Differential diagnosis includes, but is not limited to, choledocholithiasis, pancreatitis, gastritis/GERD/ulcer, viral gastroenteritis less likely ACS   The patient is on the cardiac monitor to evaluate for evidence of arrhythmia and/or significant heart rate changes.  MDM:    Epigastric pain in a patient  status post cholecystectomy years ago and recurrent pancreatitis.    Suspicion for pancreatitis will give antiemetics fluids pain control and check lipase as well as LFTs, basic labs, and CT scan of the abdomen pelvis given quite tender over the epigastrium.    Considered malignancy given his weight loss though I think less likely given this weight loss was intentional.    Considered ACS but I think less likely given epigastric tenderness and a 38 year old with low cardiac risk factors, will check EKG and troponin.  --  At the time of signout, patient stable awaiting results of imaging and remainder of lab testing as above.  Disposition will pend the results of evaluation as above and reassessment.        FINAL CLINICAL IMPRESSION(S) / ED DIAGNOSES   Final diagnoses:  Epigastric pain     Rx / DC Orders   ED Discharge Orders          Ordered    pantoprazole (PROTONIX) 40 MG tablet  Daily at bedtime        08/01/24 9357             Note:  This document was prepared using Dragon voice recognition software and may include unintentional dictation errors.    Cyrena Mylar, MD 08/01/24 [phone]

## 2024-08-01 NOTE — ED Provider Notes (Signed)
.-----------------------------------------   7:53 AM on 08/01/2024 -----------------------------------------  Blood pressure (!) 141/83, pulse 63, temperature (!) 97.5 F (36.4 C), temperature source Oral, resp. rate 16, height 6' 1 (1.854 m), weight 79.4 kg, SpO2 100%.  Assuming care from Dr. Cyrena.  In short, Ryan Potts is a 38 y.o. male with a chief complaint of abdominal pain.  Refer to the original H&P for additional details.  The current plan of care is to follow-up CT.  CT imaging shows colitis.  Will start him on Augmentin.  On reassessment symptoms are improving.  Discussed about imaging and lab results, will prescribe him Augmentin, Zofran , instructed to follow-up with primary care next week to get reassessed.  Encouraged hydration.  Considered but no indication for inpatient admission at this time, he safe for outpatient management.  Will discharge with strict return precautions.  Shared decision making done with patient and he is agreeable with this plan.   Clinical Course as of 08/01/24 0846  Fri Aug 01, 2024  9255 CT ABDOMEN PELVIS W CONTRAST IMPRESSION: 1. Mild colonic wall thickening involving the descending colon and proximal sigmoid colon. Findings are suggestive for mild colitis. 2. No other acute abnormality in the abdomen or pelvis. 3. Aortic Atherosclerosis (ICD10-I70.0).   [TT]  [phone] Independent review of labs, troponins not elevated, electrolytes not severely deranged, he has mild leukocytosis, lipase is normal, LFTs are normal. [TT]  0757 Urinalysis, Routine w reflex microscopic -Urine, Clean Catch(!) Not consistent with UTI [TT]  0815 Troponin T, High Sensitivity Troponin x 2 is negative. [TT]    Clinical Course User Index [TT] Waymond Lorelle Cummins, MD     Medications  sodium chloride 0.9 % bolus 1,000 mL (0 mLs Intravenous Stopped 08/01/24 0730)  morphine (PF) 4 MG/ML injection 4 mg (4 mg Intravenous Given 08/01/24 0557)  ketorolac (TORADOL) 15 MG/ML  injection 15 mg (15 mg Intravenous Given 08/01/24 0556)  ondansetron  (ZOFRAN ) injection 4 mg (4 mg Intravenous Given 08/01/24 0556)  iohexol (OMNIPAQUE) 300 MG/ML solution 100 mL (100 mLs Intravenous Contrast Given 08/01/24 0721)  amoxicillin -clavulanate (AUGMENTIN) 875-125 MG per tablet 1 tablet (1 tablet Oral Given 08/01/24 0844)     ED Discharge Orders          Ordered    pantoprazole (PROTONIX) 40 MG tablet  Daily at bedtime        08/01/24 0642    amoxicillin -clavulanate (AUGMENTIN) 875-125 MG tablet  2 times daily        08/01/24 0810    ondansetron  (ZOFRAN -ODT) 4 MG disintegrating tablet  Every 8 hours PRN        08/01/24 0810           Final diagnoses:  Epigastric pain  Colitis  Nausea and vomiting, unspecified vomiting type      Waymond Lorelle Cummins, MD 08/01/24 [phone]

## 2024-08-01 NOTE — Discharge Instructions (Addendum)
 I prescribed your Protonix to take daily to help with stomach acid related pain.  Please take the antibiotics as prescribed for your colitis, I have also prescribed you some Zofran  for nausea.  Please be sure to keep yourself hydrated, please follow-up with your primary care doctor next week to get reassessed.
